# Patient Record
Sex: Male | Born: 2000 | Race: White | Hispanic: No | Marital: Single | Smoking: Never smoker
Health system: Southern US, Community
[De-identification: ages and names within clinical notes are randomized; demographics above are authoritative.]

---
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
## 2020-01-17 IMAGING — MR MR ANKLE*R* W/O CM
4 of 6 series · 12 of 40 positions shown · non-contrast
Comparison: Radiographs 02/17/2018.  CT 02/21/2018.

CLINICAL DATA: Chronic lateral ankle pain with swelling, popping
and limited weight-bearing. Sprain of tibiofibular ligament.
Instability.

EXAM:
MRI OF THE RIGHT ANKLE WITHOUT CONTRAST
TECHNIQUE: Multiplanar, multisequence MR imaging of the ankle was performed. No
intravenous contrast was administered.

[Series 3: PD fat-sat · axial · right · 3.0mm · 0.28mm/px · z∈[-35,+89]mm · 3 of 39 slices shown]
[im 8/39]
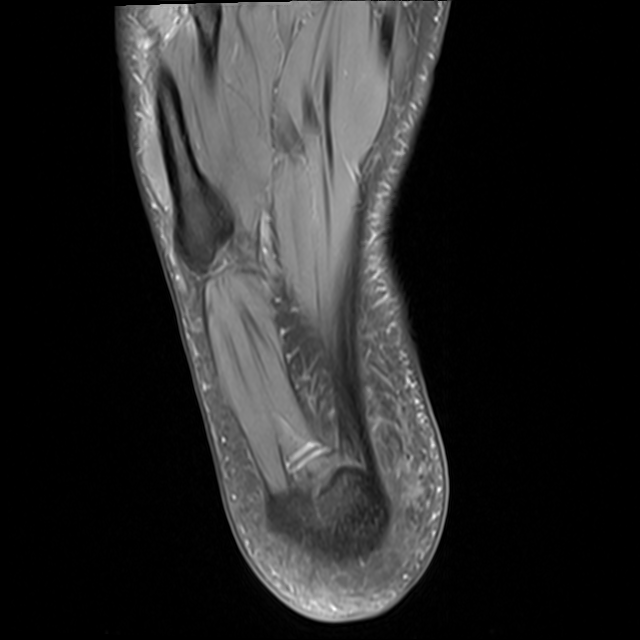
[im 23/39]
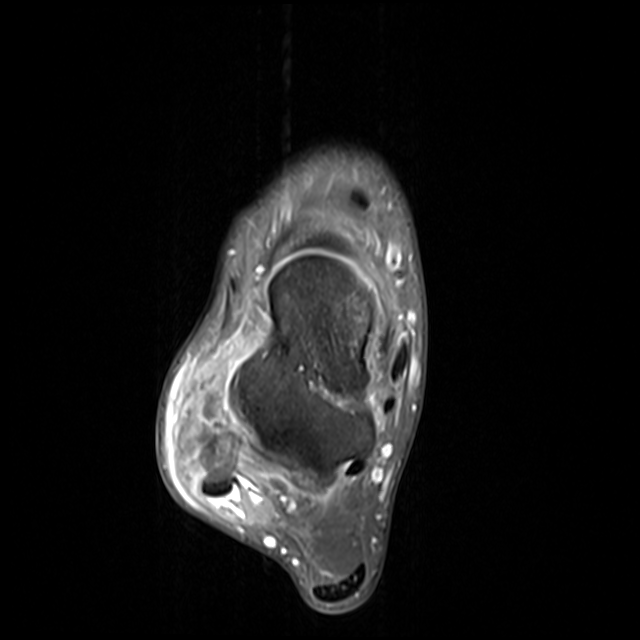
[im 39/39]
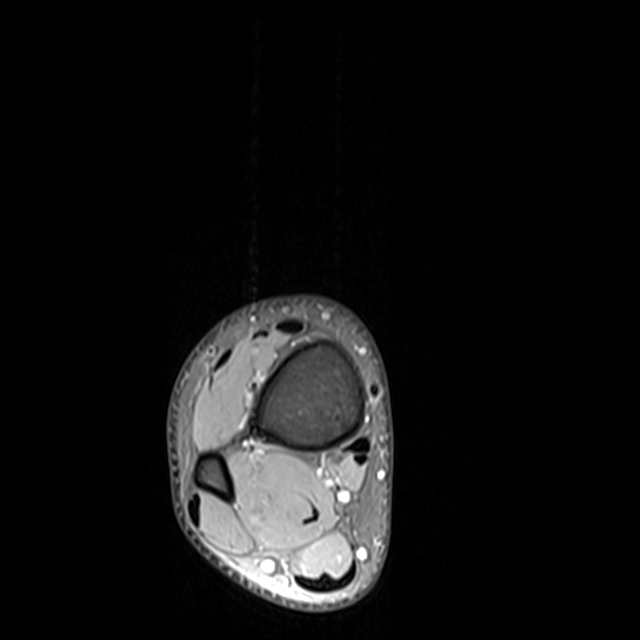

[Series 4: T2 fat-sat · axial · right · 3.0mm · 0.28mm/px · z∈[-39,+61]mm · 3 of 39 slices shown (1 of 2)]
[im 7/39]
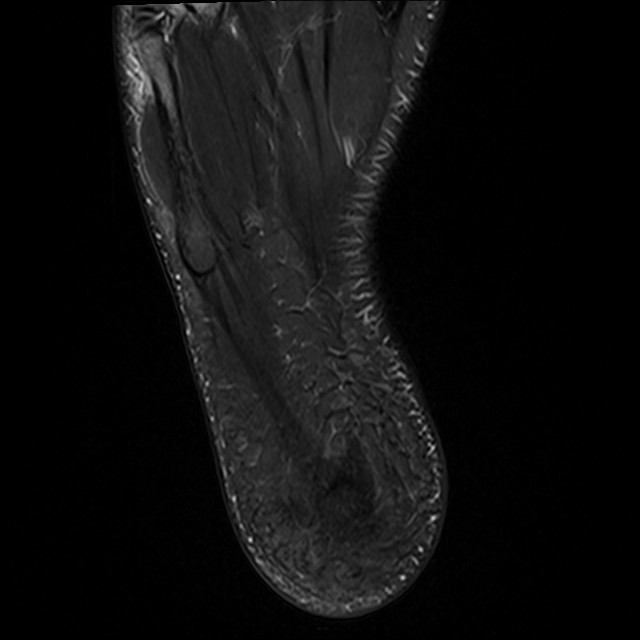
[im 20/39]
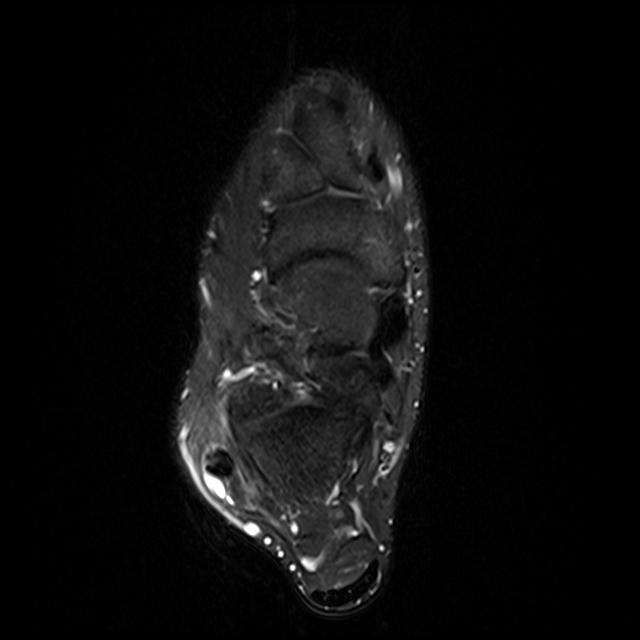
[im 32/39]
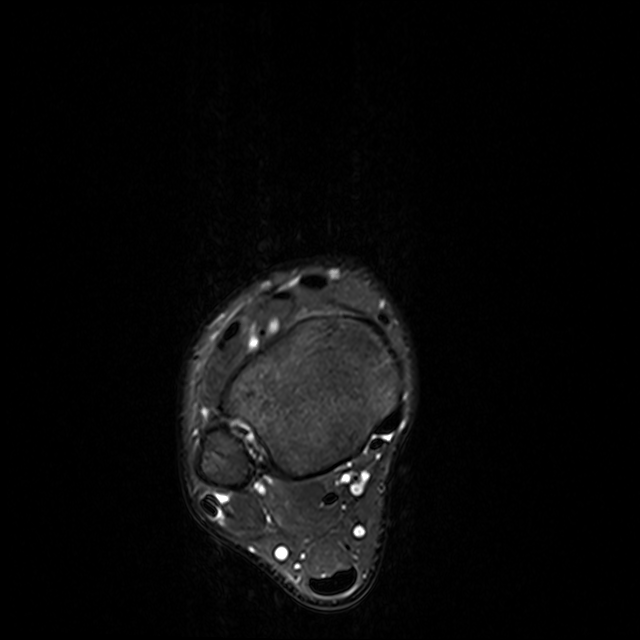

[Series 6: T1 · sagittal · right · 4.0mm · 0.28mm/px · 3 of 33 slices shown]
[im 7/33]
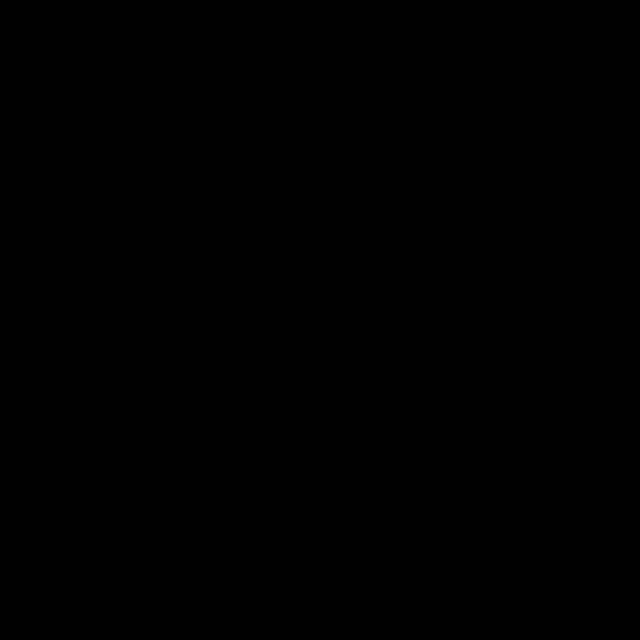
[im 20/33]
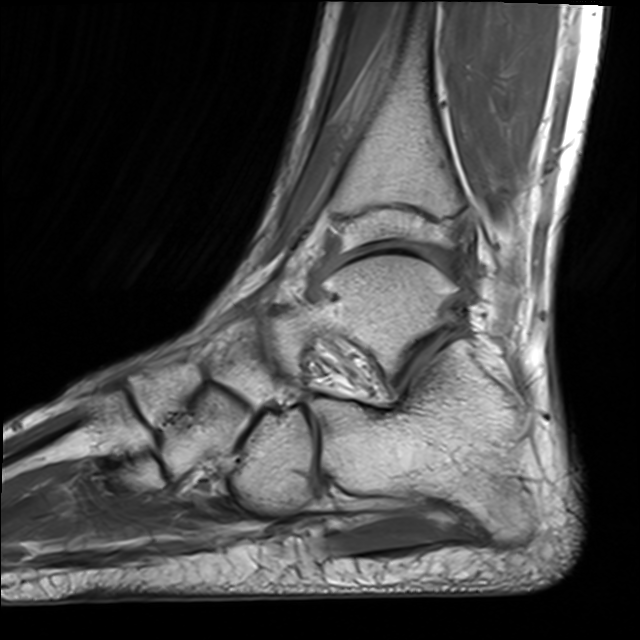
[im 33/33]
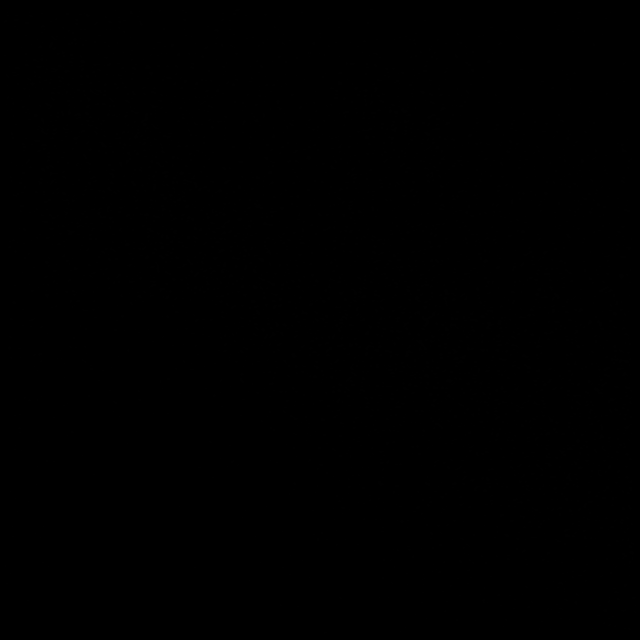

[Series 9: T2 fat-sat · coronal · right · 3.0mm · 0.27mm/px · 3 of 53 slices shown (2 of 2)]
[im 7/53]
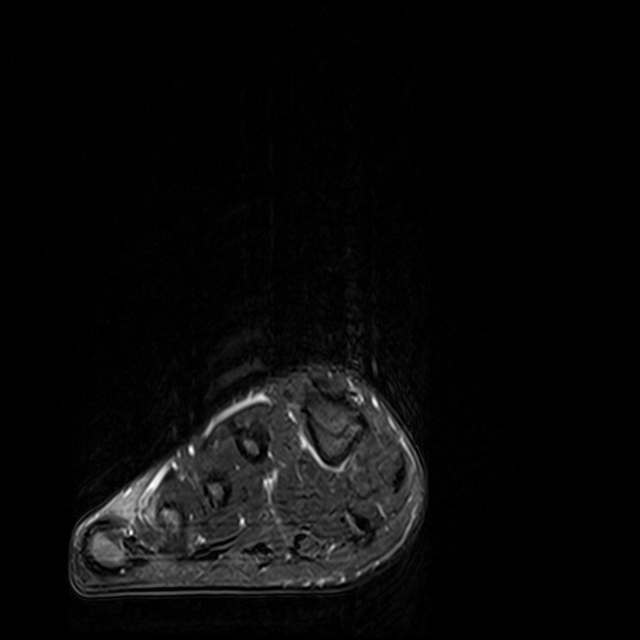
[im 27/53]
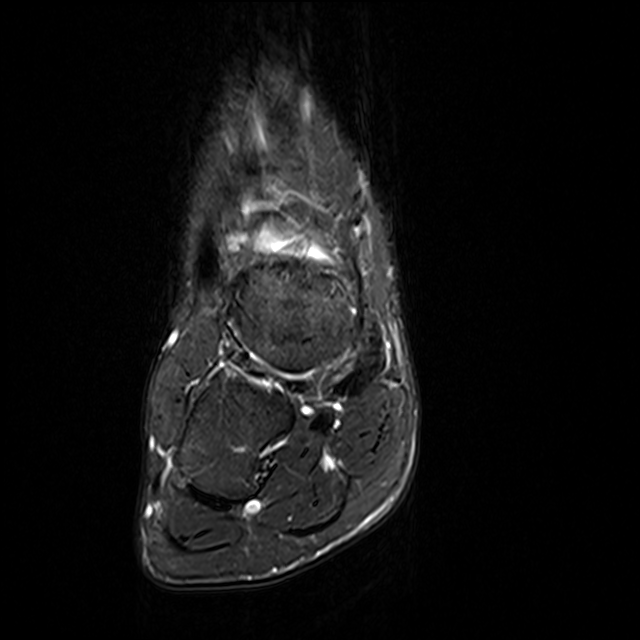
[im 46/53]
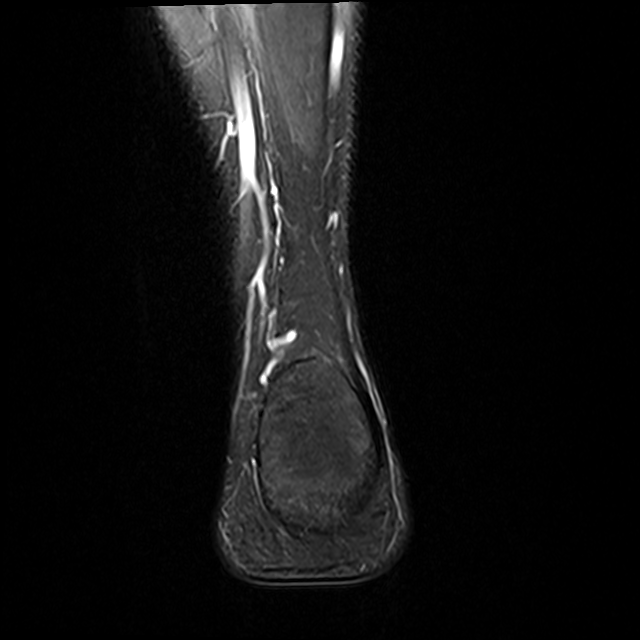

[12 of 40 positions shown; findings below may reference images not displayed]

FINDINGS: TENDONS

Peroneal: There is a moderate amount of fluid within the peroneal
tendon sheath. The peroneal tendons are intact and normally located.
The peroneal retinaculum is not well visualized and may be
chronically torn.

Posteromedial: Intact and normally positioned.

Anterior: Intact and normally positioned.

Achilles: Intact.

Plantar Fascia: Intact.

LIGAMENTS

Lateral: Probable chronic tear of the anterior talofibular ligament
associated with chronic avulsion fracture fragments adjacent to the
fibular tip. The posterior talofibular, calcaneal fibular and
inferior tibiofibular ligaments appear intact.

Medial: There is mild T2 hyperintensity within the deltoid ligament
which appears intact. The visualized components of the spring
ligament are intact.

CARTILAGE AND BONES

Ankle Joint: Small ankle joint effusion. The talar dome and tibial
plafond appear normal.

Subtalar Joints/Sinus Tarsi: Unremarkable.

Bones: As above, well corticated bone fragments adjacent to the
fibular tip without marrow edema, likely chronic avulsion fractures.
There is some bone marrow edema within the medial malleolus and
adjacent medial talar body. No other tarsal bone circle
abnormalities. There is no edema anteriorly in the calcaneus.

Other: Small os trigonum without abnormal marrow signal.
IMPRESSION: 1. Moderate peroneal tenosynovitis. The peroneal retinaculum is not
well visualized and may be chronically torn, although there is no
significant patellar subluxation.
2. Probable chronic tear of the anterior talofibular ligament with
associated chronic avulsion fracture fragments adjacent to the
fibular tip. The additional lateral ankle ligaments appear intact.
3. Marrow edema within the medial malleolus and adjacent talar body,
possibly from recent ankle inversion injury. No other significant
tarsal bone abnormalities.
4. Small ankle joint effusion without associated osteochondral
lesion.

## 2020-08-13 DIAGNOSIS — S39011A Strain of muscle, fascia and tendon of abdomen, initial encounter: Secondary | ICD-10-CM | POA: Diagnosis not present

## 2020-08-13 DIAGNOSIS — S76012A Strain of muscle, fascia and tendon of left hip, initial encounter: Secondary | ICD-10-CM | POA: Diagnosis not present

## 2020-08-13 DIAGNOSIS — Z6824 Body mass index (BMI) 24.0-24.9, adult: Secondary | ICD-10-CM | POA: Diagnosis not present

## 2020-08-23 DIAGNOSIS — R0981 Nasal congestion: Secondary | ICD-10-CM | POA: Diagnosis not present

## 2020-08-23 DIAGNOSIS — Z889 Allergy status to unspecified drugs, medicaments and biological substances status: Secondary | ICD-10-CM | POA: Diagnosis not present

## 2020-08-23 DIAGNOSIS — J029 Acute pharyngitis, unspecified: Secondary | ICD-10-CM | POA: Diagnosis not present

## 2020-08-23 DIAGNOSIS — J3489 Other specified disorders of nose and nasal sinuses: Secondary | ICD-10-CM | POA: Diagnosis not present

## 2020-09-10 ENCOUNTER — Encounter: Payer: Self-pay | Admitting: Adult Health

## 2020-09-10 ENCOUNTER — Telehealth (INDEPENDENT_AMBULATORY_CARE_PROVIDER_SITE_OTHER): Payer: BC Managed Care – PPO | Admitting: Adult Health

## 2020-09-10 ENCOUNTER — Other Ambulatory Visit: Payer: Self-pay

## 2020-09-10 VITALS — BP 120/70 | Temp 98.2°F | Ht 74.0 in | Wt 176.0 lb

## 2020-09-10 DIAGNOSIS — J0100 Acute maxillary sinusitis, unspecified: Secondary | ICD-10-CM | POA: Diagnosis not present

## 2020-09-10 DIAGNOSIS — J029 Acute pharyngitis, unspecified: Secondary | ICD-10-CM

## 2020-09-10 LAB — POCT RAPID STREP A (OFFICE): Rapid Strep A Screen: NEGATIVE

## 2020-09-10 MED ORDER — AMOXICILLIN-POT CLAVULANATE 875-125 MG PO TABS: 1.0000 | ORAL_TABLET | Freq: Two times a day (BID) | ORAL | 0 refills | Status: DC

## 2020-09-10 MED ORDER — MAGIC MOUTHWASH W/LIDOCAINE
5.0000 mL | Freq: Three times a day (TID) | ORAL | 0 refills | Status: DC | PRN
Start: 2020-09-10 — End: 2022-12-06

## 2020-09-10 NOTE — Progress Notes (Signed)
Subjective:    Patient ID: Travis Ortiz, male    DOB: [birth year], 20 y.o.   MRN: [id number]  HPI  20 year old male who  has no past medical history on file.   He presents to the office today for an acute issue. His symptoms have been waxing and waning for the last 2-3 weeks. Symptoms include sinus pain and pressure, nasal congeston, PND, sore throat, and cough. Denies fevers or chills. Has been using his normal allergy medication as well as various OTC cold medications without resolution     Review of Systems See HPI   History reviewed. No pertinent past medical history.  Social History   Socioeconomic History  . Marital status: Single    Spouse name: Not on file  . Number of children: Not on file  . Years of education: Not on file  . Highest education level: Not on file  Occupational History  . Not on file  Tobacco Use  . Smoking status: Never Smoker  . Smokeless tobacco: Never Used  Vaping Use  . Vaping Use: Never used  Substance and Sexual Activity  . Alcohol use: Never  . Drug use: Never  . Sexual activity: Not on file  Other Topics Concern  . Not on file  Social History Narrative  . Not on file   Social Determinants of Health   Financial Resource Strain: Not on file  Food Insecurity: Not on file  Transportation Needs: Not on file  Physical Activity: Not on file  Stress: Not on file  Social Connections: Not on file  Intimate Partner Violence: Not on file    History reviewed. No pertinent surgical history.  Family History  Problem Relation Age of Onset  . Thyroid disease Mother   . Thyroid disease Maternal Grandfather   . Prostate cancer Maternal Grandfather        died at 15 from Prostate CA     No Known Allergies  No current outpatient medications on file prior to visit.   No current facility-administered medications on file prior to visit.    BP 120/70   Temp 98.2 F (36.8 C) (Oral)   Ht 6\' 2"  (1.88 m)   Wt 176 lb (79.8 kg)   BMI  22.60 kg/m       Objective:   Physical Exam Vitals and nursing note reviewed.  Constitutional:      Appearance: Normal appearance.  HENT:     Nose: Congestion and rhinorrhea present. Rhinorrhea is purulent.     Right Turbinates: Enlarged and swollen.     Left Turbinates: Enlarged and swollen.     Right Sinus: Maxillary sinus tenderness present.     Left Sinus: Maxillary sinus tenderness present.     Mouth/Throat:     Lips: Pink.     Mouth: Mucous membranes are moist.     Tongue: Lesions (cancker sore of tip of tongue) present.     Pharynx: Uvula midline. Oropharyngeal exudate present. No pharyngeal swelling.     Tonsils: Tonsillar exudate present. No tonsillar abscesses. 3+ on the right. 3+ on the left.     Comments: Hypertrophic tonsils  Musculoskeletal:        General: Normal range of motion.  Skin:    General: Skin is warm.     Capillary Refill: Capillary refill takes less than 2 seconds.  Neurological:     General: No focal deficit present.     Mental Status: He is alert and oriented to person,  place, and time.  Psychiatric:        Mood and Affect: Mood normal.        Behavior: Behavior normal.        Thought Content: Thought content normal.        Judgment: Judgment normal.       Assessment & Plan:  1. Acute non-recurrent maxillary sinusitis - Will treat due to symptoms and duration - amoxicillin-clavulanate (AUGMENTIN) 875-125 MG tablet; Take 1 tablet by mouth 2 (two) times daily.  Dispense: 20 tablet; Refill: 0 - Follow up if no improvement in the next 2-3 days   2. Sore throat  - POC Rapid Strep A; Future- negative  - Will cover with augmentin  - Culture, Group A Strep; Future - POC Rapid Strep A - magic mouthwash w/lidocaine SOLN; Take 5 mLs by mouth 3 (three) times daily as needed.  Dispense: 180 mL; Refill: 0 - Culture, Group A Strep   Shirline Frees, NP

## 2020-09-12 LAB — CULTURE, GROUP A STREP
MICRO NUMBER:: 11871691
SPECIMEN QUALITY:: ADEQUATE

## 2020-09-18 ENCOUNTER — Encounter: Payer: Self-pay | Admitting: Adult Health

## 2020-09-20 ENCOUNTER — Telehealth: Payer: BC Managed Care – PPO | Admitting: Family Medicine

## 2020-09-20 DIAGNOSIS — Z0289 Encounter for other administrative examinations: Secondary | ICD-10-CM

## 2020-09-25 DIAGNOSIS — K1329 Other disturbances of oral epithelium, including tongue: Secondary | ICD-10-CM | POA: Diagnosis not present

## 2020-09-25 DIAGNOSIS — K140 Glossitis: Secondary | ICD-10-CM | POA: Diagnosis not present

## 2020-09-26 ENCOUNTER — Ambulatory Visit: Payer: BC Managed Care – PPO | Admitting: Adult Health

## 2021-09-14 DIAGNOSIS — R590 Localized enlarged lymph nodes: Secondary | ICD-10-CM | POA: Diagnosis not present

## 2022-12-06 ENCOUNTER — Ambulatory Visit: Payer: BC Managed Care – PPO

## 2022-12-06 ENCOUNTER — Ambulatory Visit
Admission: RE | Admit: 2022-12-06 | Discharge: 2022-12-06 | Disposition: A | Discharge status: Home / Self Care | Payer: BC Managed Care – PPO | Source: Ambulatory Visit | Attending: Family Medicine | Admitting: Family Medicine

## 2022-12-06 VITALS — BP 120/70 | HR 58 | Temp 97.8°F | Resp 16

## 2022-12-06 DIAGNOSIS — S161XXA Strain of muscle, fascia and tendon at neck level, initial encounter: Secondary | ICD-10-CM | POA: Diagnosis not present

## 2022-12-06 DIAGNOSIS — G44209 Tension-type headache, unspecified, not intractable: Secondary | ICD-10-CM | POA: Diagnosis not present

## 2022-12-06 NOTE — Discharge Instructions (Addendum)
Take ibuorofen 800 mg up to three times a day for headache pain [facility] has CT scanning if you desire

## 2022-12-06 NOTE — ED Triage Notes (Signed)
Pt states since Wednesday he has been having headache that is worse at night. [place] and may have hurt his head at that time. Symptoms are not improving, states headache is mostly in the back of his head and worse with certain neck movements. Denies vision changes, N/V or lethargy.
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
## 2022-12-07 ENCOUNTER — Telehealth: Payer: Self-pay

## 2023-02-17 DIAGNOSIS — B078 Other viral warts: Secondary | ICD-10-CM | POA: Diagnosis not present

## 2023-02-17 DIAGNOSIS — B079 Viral wart, unspecified: Secondary | ICD-10-CM | POA: Diagnosis not present

## 2023-02-17 DIAGNOSIS — R208 Other disturbances of skin sensation: Secondary | ICD-10-CM | POA: Diagnosis not present

## 2023-02-17 DIAGNOSIS — L2989 Other pruritus: Secondary | ICD-10-CM | POA: Diagnosis not present

## 2023-05-05 DIAGNOSIS — J189 Pneumonia, unspecified organism: Secondary | ICD-10-CM | POA: Diagnosis not present

## 2023-05-05 DIAGNOSIS — H6593 Unspecified nonsuppurative otitis media, bilateral: Secondary | ICD-10-CM | POA: Diagnosis not present

## 2023-05-17 ENCOUNTER — Encounter: Payer: Self-pay | Admitting: Adult Health

## 2023-05-20 ENCOUNTER — Ambulatory Visit: Payer: BC Managed Care – PPO | Admitting: Urgent Care

## 2023-05-20 ENCOUNTER — Encounter: Payer: Self-pay | Admitting: Urgent Care

## 2023-05-20 VITALS — BP 133/70 | HR 66 | Ht 74.0 in | Wt 191.4 lb

## 2023-05-20 DIAGNOSIS — H6593 Unspecified nonsuppurative otitis media, bilateral: Secondary | ICD-10-CM

## 2023-05-20 DIAGNOSIS — Z8701 Personal history of pneumonia (recurrent): Secondary | ICD-10-CM

## 2023-05-20 DIAGNOSIS — R051 Acute cough: Secondary | ICD-10-CM

## 2023-05-20 MED ORDER — GUAIFENESIN ER 600 MG PO TB12: 600.0000 mg | ORAL_TABLET | Freq: Two times a day (BID) | ORAL | 0 refills | Status: AC | PRN

## 2023-05-20 NOTE — Progress Notes (Signed)
New Patient Office Visit  Subjective:  Patient ID: PONG PAYE, male    DOB: [birth year]  Age: 23 y.o. MRN: [id number]  CC:  Chief Complaint  Patient presents with   New Patient (Initial Visit)    Has a lingering cough due to pneumonia and raspy voice. Feeling fatigue    Discussed the use of AI software for clinical note transcription with the patient who gave verbal consent to proceed.  HPI Travis Ortiz presents to establish care.  History of Present Illness The patient, previously diagnosed with pneumonia, presented for a follow-up visit after completing a course of Augmentin. The initial diagnosis was made at an urgent care center based on auscultation findings, without any imaging studies. The patient reported that the medication helped alleviate fever and other symptoms, but a raspy voice and a lingering cough persisted. The patient denied any worsening of symptoms during the treatment course.  The patient reported a return to regular gym activities, but noted some fatigue, which he attributed to either the recent illness or a lack of recent physical activity. The onset of the initial illness was reported to be around Christmas, with symptoms resembling a common cold or flu. The patient attempted home remedies initially, but as the symptoms progressively worsened, he sought medical attention.  The patient was also prescribed an inhaler (Albuterol) during the initial visit, despite no history of asthma or chronic lung issues. The inhaler was used intermittently, primarily to manage breathlessness during speech. The patient also reported using Flonase, a steroid nasal spray, as prescribed by the urgent care provider due to fluid detected behind the ears during an ear examination. He is, however, asymptomatic from an ear perspective.  The patient denied any history of smoking and reported no known allergies. Family history was significant for thyroid issues, but the patient has not  been diagnosed with any thyroid-related conditions. The patient reported being generally healthy, with typically one significant illness per year. The patient recently graduated from [place] and started a new job at a [employer]. The patient expressed a desire to schedule an annual physical in the near future.   Outpatient Encounter Medications as of 05/20/2023  Medication Sig   albuterol (VENTOLIN HFA) 108 (90 Base) MCG/ACT inhaler Inhale into the lungs.   fluticasone (FLONASE) 50 MCG/ACT nasal spray one spray by Both Nostrils route daily.   guaiFENesin (MUCINEX) 600 MG 12 hr tablet Take 1 tablet (600 mg total) by mouth 2 (two) times daily as needed for cough or to loosen phlegm.   [DISCONTINUED] amoxicillin-clavulanate (AUGMENTIN) 875-125 MG tablet Take 1 tablet by mouth 2 (two) times daily. (Patient not taking: Reported on 05/20/2023)   [DISCONTINUED] benzonatate (TESSALON) 100 MG capsule Take by mouth. (Patient not taking: Reported on 05/20/2023)   [DISCONTINUED] promethazine-dextromethorphan (PROMETHAZINE-DM) 6.25-15 MG/5ML syrup Take by mouth. (Patient not taking: Reported on 05/20/2023)   No facility-administered encounter medications on file as of 05/20/2023.    History reviewed. No pertinent past medical history.  History reviewed. No pertinent surgical history.  Family History  Problem Relation Age of Onset   Thyroid disease Mother    Thyroid disease Maternal Grandfather    Prostate cancer Maternal Grandfather        died at 57 from Prostate [place]     Social History   Socioeconomic History   Marital status: Single    Spouse name: Not on file   Number of children: Not on file   Years of education: Not on file  Highest education level: Not on file  Occupational History   Not on file  Tobacco Use   Smoking status: Never   Smokeless tobacco: Never  Vaping Use   Vaping status: Never Used  Substance and Sexual Activity   Alcohol use: Not Currently   Drug use: Never    Sexual activity: Not Currently  Other Topics Concern   Not on file  Social History Narrative   Not on file   Social Drivers of Health   Financial Resource Strain: Not on file  Food Insecurity: Not on file  Transportation Needs: Not on file  Physical Activity: Not on file  Stress: Not on file  Social Connections: Not on file  Intimate Partner Violence: Not on file    ROS: as noted in HPI  Objective:  BP 133/70   Pulse 66   Ht 6\' 2"  (1.88 m)   Wt 191 lb 6.4 oz (86.8 kg)   SpO2 95%   BMI 24.57 kg/m   Physical Exam Vitals and nursing note reviewed.  Constitutional:      General: He is not in acute distress.    Appearance: Normal appearance. He is not ill-appearing, toxic-appearing or diaphoretic.  HENT:     Head: Normocephalic and atraumatic.     Right Ear: Ear canal and external ear normal. A middle ear effusion is present. There is no impacted cerumen.     Left Ear: Ear canal and external ear normal. A middle ear effusion is present. There is no impacted cerumen.     Nose: Nose normal.     Mouth/Throat:     Mouth: Mucous membranes are moist.     Pharynx: Oropharynx is clear. No oropharyngeal exudate or posterior oropharyngeal erythema.  Eyes:     General: No scleral icterus.       Right eye: No discharge.        Left eye: No discharge.     Extraocular Movements: Extraocular movements intact.     Pupils: Pupils are equal, round, and reactive to light.  Neck:     Thyroid: No thyroid mass, thyromegaly or thyroid tenderness.  Cardiovascular:     Rate and Rhythm: Normal rate and regular rhythm.     Pulses: Normal pulses.     Heart sounds: No murmur heard. Pulmonary:     Effort: Pulmonary effort is normal. No respiratory distress.     Breath sounds: Normal breath sounds. No stridor. No wheezing or rhonchi.     Comments: Lungs CTA without adventitious breath sounds Musculoskeletal:     Cervical back: Normal range of motion and neck supple. No rigidity or tenderness.   Lymphadenopathy:     Cervical: No cervical adenopathy.  Skin:    General: Skin is warm and dry.     Coloration: Skin is not jaundiced.     Findings: No bruising, erythema or rash.  Neurological:     General: No focal deficit present.     Mental Status: He is alert and oriented to person, place, and time.     Motor: No weakness.  Psychiatric:        Mood and Affect: Mood normal.        Behavior: Behavior normal.     Last CBC Lab Results  Component Value Date   WBC 7.3 09/20/2014   HGB 13.9 09/20/2014   HCT 40.4 09/20/2014   MCV 82.8 09/20/2014   MCH 28.5 09/20/2014   RDW 13.8 09/20/2014   PLT 346 09/20/2014   Last  metabolic panel Lab Results  Component Value Date   GLUCOSE 90 09/20/2014   NA 138 09/20/2014   K 4.1 09/20/2014   CL 100 09/20/2014   CO2 27 09/20/2014   BUN 16 09/20/2014   CREATININE 0.58 09/20/2014   CALCIUM 9.9 09/20/2014   PROT 7.7 09/20/2014   ALBUMIN 4.6 11/29/2015   BILITOT 0.3 09/20/2014   ALKPHOS 189 09/20/2014   AST 23 09/20/2014   ALT 24 09/20/2014      Assessment & Plan:  Acute cough -     guaiFENesin ER; Take 1 tablet (600 mg total) by mouth 2 (two) times daily as needed for cough or to loosen phlegm.  Dispense: 20 tablet; Refill: 0  Middle ear effusion, bilateral  History of recent pneumonia  Assessment and Plan Post-Pneumonia Recovery Persistent cough and raspy voice following treatment for presumed pneumonia with Augmentin. Lungs clear on auscultation. Fatigue possibly related to recent illness and return to gym activities. -Trial of over-the-counter Mucinex to help clear residual mucus. -Continue hydration. -Consider short course of Prednisone if symptoms persist or worsen, deferred by patient today.  Eustachian Tube Dysfunction Fluid noted behind eardrum, possibly related to recent illness. No current symptoms of ear discomfort or hearing changes. -Continue Flonase as prescribed.  General Health Maintenance -Schedule  annual physical in approximately 6 months. -Review [place] vaccine registry at next visit for update on immunization status.  Return in about 6 months (around 11/17/2023) for Annual Physical.   Maretta Bees, PA

## 2023-05-20 NOTE — Patient Instructions (Signed)
Great to meet you today.  Your lungs today are clear and your vitals are normal. Your current symptoms are lingering secondary to your recent infection.  Continue adequate hydration with water!!  Start taking mucinex, prescribed today, twice daily. This will help you expel any remaining phlegm or mucous in your chest.  If your symptoms persist, or worsening voice hoarseness is noted, a short course of prednisone could be considered. Return for recheck if you develop a fever, shortness of breath or chest discomfort.  Please schedule a routine annual physical this year.
